# Patient Record
Sex: Male | Born: 1985
Health system: Southern US, Community
[De-identification: ages and names within clinical notes are randomized; demographics above are authoritative.]

---
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
## 2020-03-04 DIAGNOSIS — Z23 Encounter for immunization: Secondary | ICD-10-CM | POA: Diagnosis not present

## 2020-05-08 DIAGNOSIS — S0292XA Unspecified fracture of facial bones, initial encounter for closed fracture: Secondary | ICD-10-CM | POA: Diagnosis not present

## 2020-05-08 DIAGNOSIS — S0240EA Zygomatic fracture, right side, initial encounter for closed fracture: Secondary | ICD-10-CM | POA: Diagnosis not present

## 2020-05-08 DIAGNOSIS — S0240FA Zygomatic fracture, left side, initial encounter for closed fracture: Secondary | ICD-10-CM | POA: Diagnosis not present

## 2020-05-08 DIAGNOSIS — S0081XA Abrasion of other part of head, initial encounter: Secondary | ICD-10-CM | POA: Diagnosis not present

## 2020-05-08 DIAGNOSIS — S02401A Maxillary fracture, unspecified, initial encounter for closed fracture: Secondary | ICD-10-CM | POA: Diagnosis not present

## 2020-05-08 DIAGNOSIS — S0285XA Fracture of orbit, unspecified, initial encounter for closed fracture: Secondary | ICD-10-CM | POA: Diagnosis not present

## 2020-05-08 DIAGNOSIS — Y9229 Other specified public building as the place of occurrence of the external cause: Secondary | ICD-10-CM | POA: Diagnosis not present

## 2020-05-08 DIAGNOSIS — S02841A Fracture of lateral orbital wall, right side, initial encounter for closed fracture: Secondary | ICD-10-CM | POA: Diagnosis not present

## 2020-05-08 DIAGNOSIS — S022XXA Fracture of nasal bones, initial encounter for closed fracture: Secondary | ICD-10-CM | POA: Diagnosis not present

## 2020-05-08 DIAGNOSIS — S02842A Fracture of lateral orbital wall, left side, initial encounter for closed fracture: Secondary | ICD-10-CM | POA: Diagnosis not present

## 2020-05-08 DIAGNOSIS — S01111A Laceration without foreign body of right eyelid and periocular area, initial encounter: Secondary | ICD-10-CM | POA: Diagnosis not present

## 2020-05-08 DIAGNOSIS — S0231XA Fracture of orbital floor, right side, initial encounter for closed fracture: Secondary | ICD-10-CM | POA: Diagnosis not present

## 2020-05-08 DIAGNOSIS — S0232XA Fracture of orbital floor, left side, initial encounter for closed fracture: Secondary | ICD-10-CM | POA: Diagnosis not present

## 2020-05-08 DIAGNOSIS — S0181XA Laceration without foreign body of other part of head, initial encounter: Secondary | ICD-10-CM | POA: Diagnosis not present

## 2020-05-08 DIAGNOSIS — S0240CA Maxillary fracture, right side, initial encounter for closed fracture: Secondary | ICD-10-CM | POA: Diagnosis not present

## 2020-05-11 ENCOUNTER — Encounter: Payer: Self-pay | Admitting: Family Medicine

## 2020-05-11 ENCOUNTER — Ambulatory Visit (INDEPENDENT_AMBULATORY_CARE_PROVIDER_SITE_OTHER): Payer: BC Managed Care – PPO | Admitting: Family Medicine

## 2020-05-11 ENCOUNTER — Other Ambulatory Visit: Payer: Self-pay

## 2020-05-11 VITALS — BP 110/76 | HR 78 | Temp 98.5°F | Resp 12 | Ht 71.0 in | Wt 178.6 lb

## 2020-05-11 DIAGNOSIS — S0990XA Unspecified injury of head, initial encounter: Secondary | ICD-10-CM | POA: Diagnosis not present

## 2020-05-11 DIAGNOSIS — S0993XA Unspecified injury of face, initial encounter: Secondary | ICD-10-CM | POA: Diagnosis not present

## 2020-05-11 LAB — POC URINALSYSI DIPSTICK (AUTOMATED)
Bilirubin, UA: NEGATIVE
Blood, UA: NEGATIVE
Glucose, UA: NEGATIVE
Leukocytes, UA: NEGATIVE
Nitrite, UA: NEGATIVE
Protein, UA: POSITIVE — AB
Spec Grav, UA: 1.02 (ref 1.010–1.025)
Urobilinogen, UA: 1 E.U./dL
pH, UA: 6.5 (ref 5.0–8.0)

## 2020-05-11 MED ORDER — ALPRAZOLAM 0.25 MG PO TABS: 0.2500 mg | ORAL_TABLET | Freq: Two times a day (BID) | ORAL | 1 refills | Status: AC | PRN

## 2020-05-11 NOTE — Patient Instructions (Addendum)
Ibuprofen/Advil/Motrin 200 mg tabs 2 tabs every 4-6 hours to a max of 2400 mg in 24 hours (Aleve/Naproxen)   Tylenol/Acetaminophen ES 500 mg 1-2 tabs up to three x a day to a max of 3000 mg in 24 hours  Orbital Floor Fracture  An orbital floor fracture is a break in the bottom part of the bony structure that protects the eye (orbital floor). The orbital floor separates the eye from the sinus. If the fracture in the orbital floor allows nearby muscle, tissue, or both to get trapped inside of the fracture, it is called an orbital floor fracture with entrapment. This usually causes swelling and pain, and it often affects vision. Orbital floor fracture with entrapment may also be called a trapdoor fracture. This is more commonly seen in children and may cause a nervous system reaction that requires immediate treatment. What are the causes? This condition is caused by an injury to the eye, such as a hard, direct hit. What are the signs or symptoms? Symptoms of this condition include:  Swelling and bruising around the eye.  Pain or tenderness around the eye.  Difficulty looking up, down, or side to side.  Changes in vision, such as blurry vision or seeing two of everything (double vision).  The injured eye looking sunken compared to the other eye.  Numbness of the cheek, inner nose, and upper gum on the same side of the face as the injury. How is this diagnosed? This condition may be diagnosed based on:  Your symptoms and medical history.  An eye exam.  An X-ray or a CT scan. How is this treated? Treatment for this condition depends on the severity and type of fracture, if there is entrapment, and how old you are. Orbital floor fracture without entrapment Treatment is usually not needed if there is no entrapment. In almost all cases, the broken bone heals on its own with time. To help manage symptoms, treatment may include:  Applying ice to the injured area.  Taking medicines for  pain relief.  Taking antibiotics to treat or prevent infection.  Taking steroids to reduce swelling.  Making an appointment with an eye specialist (ophthalmologist) for follow-up care. Orbital floor fracture with entrapment If there is entrapment, treatment is usually started after all the swelling around the eye has gone away, which may take 1-2 weeks. After swelling goes away, an ophthalmologist may try to free the entrapped tissue if you still have double vision. If this is not possible, you may need surgery. If you have double vision only when looking up, your health care provider will discuss treatment options with you. Some people who do not spend a lot of time looking up choose not to have more treatment. Others who need to look up often for work, such as Medical sales representative, need treatment. Orbital trapdoor fracture You may need surgery sooner if you have a trapdoor fracture or have the following symptoms:  Nausea or vomiting.  A slow heart rate.  Dizziness.  Loss of consciousness. Follow these instructions at home: Activity  Do not drive or operate machinery until your health care provider says that it is safe. Be aware that if you are only using one eye to see, you may have trouble judging distances (depth perception). Ask your health care provider when it is safe to drive.  Avoid traveling by plane or going to high-altitude areas. This may slow the healing of your swelling and may increase sinus pain.  Return to your normal activities as  told by your health care provider. Ask your health care provider what activities are safe for you.  Follow instructions from your health care provider about wearing protective glasses or goggles for work. Managing pain, bruising, and swelling   If directed, put ice on the injured eye. To do this: ? Put ice in a plastic bag. ? Place a towel between your skin and the bag. ? Leave the ice on for 20 minutes, 2-3 times a day.  If recommended by  your health care provider, sleep with one or two extra pillows under your head. Keeping your head raised slightly when lying down can help with swelling and pain. Medicines  Take over-the-counter and prescription medicines only as told by your health care provider.  If you were prescribed an antibiotic medicine, take it as told by your health care provider. Do not stop taking the antibiotic even if you start to feel better. General instructions  Do not touch, rub, or try to move your eye.  Do not blow your nose until your health care provider says it is okay.  Do not put a contact lens in the injured eye until your health care provider says it is okay.  Stay away from dusty areas.  Keep all follow-up visits as told by your health care provider. This is important. Contact a health care provider if:  Your vision changes, such as increased blurriness or worsening double vision.  You feel pain when you move your eyes.  You have nausea or feel light-headed when you move your eyes.  You have redness or swelling that does not go away or gets worse.  You have blood or fluid coming from your nose.  You have a fever.  You have a headache. Get help right away if you:  Have a sensation that you are seeing flashing lights.  Have sudden blindness.  Have sudden bulging of the injured eye.  Notice that your heart is beating much slower than normal.  Have dizziness.  Pass out.  Have chest pain.  Are short of breath. Summary  An orbital floor fracture is a break in the bottom part of the bony structure that protects the eye (orbital floor).  This condition may cause swelling, bruising, and pain around the injured eye. You may also have trouble looking up, down, or side to side.  Treatment depends on the severity and type of fracture, if there is entrapment, and how old you are.  You should notdrive or perform your regular activities until your health care provider says that it  is safe. This information is not intended to replace advice given to you by your health care provider. Make sure you discuss any questions you have with your health care provider. Document Revised: 05/28/2019 Document Reviewed: 08/30/2017 Elsevier Patient Education  2020 [employer]  Zygoma Fracture  A zygoma fracture is a break in one of the bones in the face. The zygoma forms the part of your cheekbone that you can feel under your eye. The main part of the zygoma meets the bone that forms the middle part of your face (maxillary bone) under your eye. The zygoma also has an arched part that extends along the side of your face to meet the bone that forms the side of your head (temporalbone).  A zygoma fracture may involve the main part of the bone, the arch of the bone, or both parts of the bone. What are the causes? This condition may be caused by an injury  or trauma to the face, which can happen from:  A car accident.  A direct blow to the face.  A sports injury.  A fall. What increases the risk? You are more likely to develop this condition if:  You play contact sports.  You are a victim of violence or participate in violent activities or behaviors. What are the signs or symptoms? Symptoms of this condition include:  Swelling.  Bruising.  Pain.  Difficulty or pain when chewing.  A feeling that your teeth are out of line. As the swelling goes down, your face may look different because your cheekbone is flat or set back (depressed). If the fracture extends into bones that support your eye (blowout fracture), you may have double vision or numbness in your cheek. How is this diagnosed? This condition may be diagnosed based on:  Your symptoms and description of the injury.  A physical exam. Your health care provider will check your cheekbone area and feel whether your zygoma is depressed or separated.  Tests to confirm the diagnosis and check for other injuries. These  tests may include: ? X-rays. ? CT scan. ? Eye exam. How is this treated? Treatment depends on the type of fracture you have and how severe it is. You may have to wait for treatment until the swelling and inflammation decrease. This injury may be treated with:  Rest. If you have a fracture that does not cause any deformity or change in your chewing (non-displaced fracture), you may not need treatment.  Taking medicine for pain.  Surgery. You may need surgery if you have trouble opening your mouth or if you have a cheekbone deformity (displaced fracture). Surgery may involve either of the following: ? A closed reduction. A small incision is made inside your mouth or on the side of your head. The surgeon inserts a smooth, blunt surgical instrument through the incision to lift the bone back into proper position. ? An open reduction. This may require a small incision over the fracture site. The fracture is put back into proper position. It will be held in place with wires or with screws and metal plates. Follow these instructions at home: Activity  Rest as told by your health care provider.  Avoid lying down on your injured side. It may help to sleep in a sitting position. Try sleeping in a reclining chair or propping yourself up with extra pillows in bed.  Do not participate in activities that put you at risk for injuring the area again.  Wear protective gear as told by your health care provider, especially when participating in sports or activities that put you at risk for re-injury. Medicines  Take over-the-counter and prescription medicines only as told by your health care provider.  Do not drive or use heavy machinery while taking prescription pain medicine.  If you are taking prescription pain medicine, take actions to prevent or treat constipation. Your health care provider may recommend that you: ? Drink enough fluid to keep your urine pale yellow. ? Eat foods that are high in  fiber, such as fresh fruits and vegetables, whole grains, and beans. ? Limit foods that are high in fat and processed sugars, such as fried or sweet foods. ? Take an over-the-counter or prescription medicine for constipation. General instructions   Avoid blowing your nose.  Wash and dry your face gently.  Do not use any products that contain nicotine or tobacco, such as cigarettes and e-cigarettes. These can delay bone healing. If you  need help quitting, ask your health care provider.  Follow instructions from your health care provider about eating or drinking restrictions. Eat a soft or liquid diet until your health care provider says it is okay for you to chew.  If directed, put ice on the injured area. ? Put ice in a plastic bag. ? Place a towel between your skin and the bag. ? Leave the ice on for 20 minutes, 2-3 times a day.  Keep all follow-up visits as told by your health care provider. This is important. Contact a health care provider if:  Pain or inflammation does not decrease with medicines.  Swelling or bruising of the injured area gets worse.  You develop any vision problems.  You have a lot of clear watery discharge from your nose.  You have a fever.  You have nausea or vomiting. Get help right away if you:  Have increased trouble moving your mouth.  Have trouble breathing or swallowing.  Develop a severe headache. Summary  A zygoma fracture is a break in one of the bones in the face. The zygoma forms the part of your cheekbone that you can feel under your eye.  This condition may be caused by an injury or trauma to the face.  Avoid blowing your nose and lying down on your injured side.  Treatment depends on the type of fracture you have and how severe it is. You may have to wait for treatment until the swelling and inflammation decrease. This information is not intended to replace advice given to you by your health care provider. Make sure you discuss  any questions you have with your health care provider. Document Revised: 01/08/2018 Document Reviewed: 10/03/2017 Elsevier Patient Education  2020 [employer].

## 2020-05-12 ENCOUNTER — Encounter: Payer: Self-pay | Admitting: Family Medicine
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
## 2020-05-13 ENCOUNTER — Ambulatory Visit (HOSPITAL_COMMUNITY)
Admission: RE | Admit: 2020-05-13 | Discharge: 2020-05-13 | Disposition: A | Discharge status: Home / Self Care | Payer: BC Managed Care – PPO | Source: Ambulatory Visit | Attending: Family Medicine | Admitting: Family Medicine

## 2020-05-13 ENCOUNTER — Other Ambulatory Visit: Payer: Self-pay

## 2020-05-13 DIAGNOSIS — S0993XA Unspecified injury of face, initial encounter: Secondary | ICD-10-CM | POA: Insufficient documentation

## 2020-05-13 DIAGNOSIS — S0990XA Unspecified injury of head, initial encounter: Secondary | ICD-10-CM | POA: Insufficient documentation

## 2020-05-13 DIAGNOSIS — H05221 Edema of right orbit: Secondary | ICD-10-CM | POA: Diagnosis not present

## 2020-05-13 DIAGNOSIS — S0240CA Maxillary fracture, right side, initial encounter for closed fracture: Secondary | ICD-10-CM | POA: Diagnosis not present

## 2020-05-13 DIAGNOSIS — G9389 Other specified disorders of brain: Secondary | ICD-10-CM | POA: Diagnosis not present

## 2020-05-13 MED ORDER — GADOBUTROL 1 MMOL/ML IV SOLN
8.5000 mL | Freq: Once | INTRAVENOUS | Status: AC | PRN
  Administered 2020-05-13: 8.5 mL via INTRAVENOUS

## 2020-06-01 ENCOUNTER — Ambulatory Visit: Payer: Self-pay | Admitting: Family Medicine

## 2020-11-09 DIAGNOSIS — U071 COVID-19: Secondary | ICD-10-CM | POA: Diagnosis not present

## 2020-11-16 DIAGNOSIS — U071 COVID-19: Secondary | ICD-10-CM | POA: Diagnosis not present
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
## 2022-01-26 ENCOUNTER — Encounter: Payer: Self-pay | Admitting: *Deleted

## 2022-03-24 DIAGNOSIS — Z6826 Body mass index (BMI) 26.0-26.9, adult: Secondary | ICD-10-CM | POA: Diagnosis not present

## 2022-03-24 DIAGNOSIS — Z0000 Encounter for general adult medical examination without abnormal findings: Secondary | ICD-10-CM | POA: Diagnosis not present

## 2022-03-24 DIAGNOSIS — Z131 Encounter for screening for diabetes mellitus: Secondary | ICD-10-CM | POA: Diagnosis not present

## 2022-09-16 DIAGNOSIS — B9689 Other specified bacterial agents as the cause of diseases classified elsewhere: Secondary | ICD-10-CM | POA: Diagnosis not present

## 2022-09-16 DIAGNOSIS — J208 Acute bronchitis due to other specified organisms: Secondary | ICD-10-CM | POA: Diagnosis not present
